# Patient Record
Sex: Male | Born: 1945 | Race: White | Hispanic: No | Marital: Married | Smoking: Former smoker
Health system: Southern US, Community
[De-identification: ages and names within clinical notes are randomized; demographics above are authoritative.]

## PROBLEM LIST (undated history)

## (undated) DIAGNOSIS — J986 Disorders of diaphragm: Secondary | ICD-10-CM

## (undated) DIAGNOSIS — J449 Chronic obstructive pulmonary disease, unspecified: Secondary | ICD-10-CM

## (undated) DIAGNOSIS — I10 Essential (primary) hypertension: Secondary | ICD-10-CM

## (undated) DIAGNOSIS — C801 Malignant (primary) neoplasm, unspecified: Secondary | ICD-10-CM

## (undated) DIAGNOSIS — C4490 Unspecified malignant neoplasm of skin, unspecified: Secondary | ICD-10-CM

## (undated) HISTORY — PX: OTHER SURGICAL HISTORY: SHX169

---

## 2002-11-03 ENCOUNTER — Encounter: Admission: RE | Admit: 2002-11-03 | Discharge: 2002-11-03 | Payer: Self-pay | Admitting: Emergency Medicine

## 2002-11-03 ENCOUNTER — Encounter: Payer: Self-pay | Admitting: Emergency Medicine

## 2003-10-02 ENCOUNTER — Ambulatory Visit (HOSPITAL_BASED_OUTPATIENT_CLINIC_OR_DEPARTMENT_OTHER): Admission: RE | Admit: 2003-10-02 | Discharge: 2003-10-02 | Payer: Self-pay | Admitting: Emergency Medicine

## 2004-08-26 ENCOUNTER — Encounter: Admission: RE | Admit: 2004-08-26 | Discharge: 2004-08-26 | Payer: Self-pay | Admitting: Emergency Medicine

## 2004-10-18 ENCOUNTER — Ambulatory Visit: Payer: Self-pay | Admitting: Pain Medicine

## 2004-11-02 ENCOUNTER — Ambulatory Visit: Payer: Self-pay | Admitting: Pain Medicine

## 2004-11-24 ENCOUNTER — Ambulatory Visit: Payer: Self-pay | Admitting: Pain Medicine

## 2004-11-30 ENCOUNTER — Ambulatory Visit: Payer: Self-pay | Admitting: Pain Medicine

## 2004-12-28 ENCOUNTER — Ambulatory Visit: Payer: Self-pay | Admitting: Pain Medicine

## 2005-01-24 ENCOUNTER — Ambulatory Visit: Payer: Self-pay | Admitting: Pain Medicine

## 2005-02-01 ENCOUNTER — Ambulatory Visit: Payer: Self-pay | Admitting: Pain Medicine

## 2005-02-22 ENCOUNTER — Ambulatory Visit: Payer: Self-pay | Admitting: Internal Medicine

## 2005-03-01 ENCOUNTER — Ambulatory Visit: Payer: Self-pay | Admitting: Internal Medicine

## 2005-03-01 ENCOUNTER — Encounter (INDEPENDENT_AMBULATORY_CARE_PROVIDER_SITE_OTHER): Payer: Self-pay | Admitting: *Deleted

## 2005-03-16 ENCOUNTER — Ambulatory Visit: Payer: Self-pay | Admitting: Pain Medicine

## 2005-03-22 ENCOUNTER — Ambulatory Visit: Payer: Self-pay | Admitting: Pain Medicine

## 2005-04-03 ENCOUNTER — Encounter: Admission: RE | Admit: 2005-04-03 | Discharge: 2005-04-03 | Payer: Self-pay | Admitting: Emergency Medicine

## 2005-05-05 ENCOUNTER — Inpatient Hospital Stay (HOSPITAL_COMMUNITY): Admission: RE | Admit: 2005-05-05 | Discharge: 2005-05-09 | Payer: Self-pay | Admitting: Neurosurgery

## 2006-04-06 ENCOUNTER — Encounter: Admission: RE | Admit: 2006-04-06 | Discharge: 2006-04-06 | Payer: Self-pay | Admitting: Emergency Medicine

## 2006-04-16 ENCOUNTER — Ambulatory Visit (HOSPITAL_COMMUNITY): Admission: RE | Admit: 2006-04-16 | Discharge: 2006-04-16 | Payer: Self-pay | Admitting: Emergency Medicine

## 2007-01-28 ENCOUNTER — Ambulatory Visit: Payer: Self-pay | Admitting: Internal Medicine

## 2007-02-12 ENCOUNTER — Encounter: Payer: Self-pay | Admitting: Internal Medicine

## 2007-02-12 ENCOUNTER — Ambulatory Visit: Payer: Self-pay | Admitting: Internal Medicine

## 2008-12-11 ENCOUNTER — Ambulatory Visit: Payer: Self-pay | Admitting: Internal Medicine

## 2008-12-22 ENCOUNTER — Ambulatory Visit: Payer: Self-pay | Admitting: Internal Medicine

## 2009-08-05 ENCOUNTER — Ambulatory Visit: Payer: Self-pay | Admitting: Internal Medicine

## 2010-01-19 ENCOUNTER — Ambulatory Visit: Payer: Self-pay | Admitting: Urology

## 2010-01-24 ENCOUNTER — Ambulatory Visit: Payer: Self-pay | Admitting: Urology

## 2011-02-10 NOTE — Op Note (Signed)
NAMEFELIBERTO, STOCKLEY                 ACCOUNT NO.:  [id number]   MEDICAL RECORD NO.:  [id number]          PATIENT TYPE:  INP   LOCATION:  3010                         FACILITY:  [facility]   PHYSICIAN:  Hilda Lias, M.D.   DATE OF BIRTH:  1946-03-14   DATE OF PROCEDURE:  05/05/2005  DATE OF DISCHARGE:                                 OPERATIVE REPORT   PREOPERATIVE DIAGNOSIS:  Status post L4 diskectomy, severe degenerative disk  disease L4-5 with facet arthropathy, chronic L5 radiculopathy.   POSTOPERATIVE DIAGNOSIS:  Status post L4 diskectomy, severe degenerative  disk disease L4-5 with facet arthropathy, chronic L5 radiculopathy.   OPERATION PERFORMED:  Bilateral L4 laminectomy, L4 facetectomy, lysis of  adhesions with decompression of the L4 and L5 nerve root.  Pedicle screws L4-  5, posterolateral arthrodesis with bone morphogenic protein and autograft.   SURGEON:  Hilda Lias, M.D.   ASSISTANT:  Payton Doughty, M.D.   ANESTHESIA:  General.   INDICATIONS FOR PROCEDURE:  The patient is being admitted because of back  pain which has been getting worse for the past two years.  The pain goes to  both sides, left worse than the right.  The patient is quite miserable.  He  has failed with conservative treatment.  The MRI shows severe case of  degenerative disk disease at the level of L4-5 with almost no space in  between with foraminal stenosis.  Surgery was advised. The risks were  explained to him in the history and physical.   DESCRIPTION OF PROCEDURE:  The patient was taken to the operating room and  after intubation, he was positioned in prone manner.  The back was prepped  with Betadine.  A midline incision from L3-L4 down to L4-L5 was made.  Muscle was retracted bilaterally. Patient had quite a bit of arthritis and  it was quite difficult to disattach the fascia and the muscle away from the  facets.  We did X-ray which indeed showed that we were at the level of L4-5.  From  then on, we proceeded to remove the spinous process of L4 and then we  went bilaterally to remove the L4 lamina and facet.  The dura mater was  thin.  The patient had quite a bit of adhesion bilaterally to the point  there was an opening the dura mater in two locations.  The nerve root  ____________ to the L4 with quite a bit of adhesion in to the canal.  Lysis  was accomplished and at the end we had good decompression of the both L4 and  L5 bilaterally.  We tried to enter the disk space but indeed the space was  so narrowed, it was impossible to go inside with 15 surgical blade.  Then  with the C-arm using the AP and lateral view, we introduced the pedicle  probe followed by pedicle screws at the level of L4-L5.  The pedicle 5.5 x  45.  The pedicle screw was secured in place using a rod with a cap.  AP and  lateral showed good position of the pedicle  screw. Then we went laterally.  We drilled the lateral aspect of the facet _________ as well as the  transverse process of 4-5 and a mix of autograft and bone morphogenic  protein was used for fusion.  From then on, the area was irrigated.  Although we closed the dura mater with 6-0 Prolene in those two locations we  left Duragen on top of that.  From then on, the area was irrigated and  closed with Vicryl and nylon.       EB/MEDQ  D:  05/05/2005  T:  05/06/2005  Job:  [id number]

## 2011-02-10 NOTE — Discharge Summary (Signed)
NAMEKILAN, BANFILL                 ACCOUNT NO.:  [id number]   MEDICAL RECORD NO.:  [id number]          PATIENT TYPE:  INP   LOCATION:  3010                         FACILITY:  [facility]   PHYSICIAN:  Hilda Lias, M.D.   DATE OF BIRTH:  07-Jul-1946   DATE OF ADMISSION:  05/05/2005  DATE OF DISCHARGE:  05/09/2005                                 DISCHARGE SUMMARY   ADMISSION DIAGNOSIS:  Degenerative disk disease L4-5 with chronic  radiculopathy.   FINAL DIAGNOSIS:  Degenerative disk disease L4-5 with chronic radiculopathy.   CLINICAL HISTORY:  The patient was admitted because of back pain with  radiation into both legs.  X-rays show a severe case of degenerative disk  disease at the level of L4-5.  Surgery was advised.   LABORATORY:  Normal.   COURSE IN THE HOSPITAL:  The patient was taken to surgery and a bilateral  __________ laminectomy with facetectomy using pedicle screw and  posterolateral arthrodesis was done.  The patient today is doing much  better.  He still has some incisional pain but not like before.  He has no  weakness.  He has been discharged today to be followed by [place] in the office  in 2 weeks.   CONDITION ON DISCHARGE:  Improving.   MEDICATION:  Mepergan, Flexeril and Neurontin.   DIET:  Regular.   ACTIVITY:  Not to drive for at least until I see him.   CONDITION AT DISCHARGE:  Improving.   FOLLOWUP:  He is going to be seen by me in 2 weeks.           ______________________________  Hilda Lias, M.D.     EB/MEDQ  D:  05/09/2005  T:  05/09/2005  Job:  [id number]

## 2011-12-20 ENCOUNTER — Encounter: Payer: Self-pay | Admitting: Internal Medicine

## 2012-05-17 ENCOUNTER — Emergency Department: Payer: Self-pay | Admitting: Emergency Medicine

## 2012-05-17 LAB — PROTIME-INR: Prothrombin Time: 12.4 secs (ref 11.5–14.7)

## 2012-05-17 LAB — BASIC METABOLIC PANEL
Anion Gap: 6 — ABNORMAL LOW (ref 7–16)
Calcium, Total: 8.7 mg/dL (ref 8.5–10.1)
Chloride: 99 mmol/L (ref 98–107)
Co2: 31 mmol/L (ref 21–32)
EGFR (Non-African Amer.): 48 — ABNORMAL LOW
Glucose: 126 mg/dL — ABNORMAL HIGH (ref 65–99)
Osmolality: 280 (ref 275–301)
Potassium: 3.7 mmol/L (ref 3.5–5.1)
Sodium: 136 mmol/L (ref 136–145)

## 2012-05-17 LAB — CBC
HCT: 39.2 % — ABNORMAL LOW
HGB: 12.8 g/dL — ABNORMAL LOW
MCH: 26.3 pg
MCHC: 32.6 g/dL
MCV: 81 fL
Platelet: 213 x10 3/mm 3
RBC: 4.86 x10 6/mm 3
RDW: 14 %
WBC: 10 x10 3/mm 3

## 2012-06-10 ENCOUNTER — Ambulatory Visit: Payer: Self-pay | Admitting: Internal Medicine

## 2012-12-25 ENCOUNTER — Encounter: Payer: Self-pay | Admitting: Internal Medicine

## 2013-01-08 ENCOUNTER — Encounter: Payer: Self-pay | Admitting: Internal Medicine

## 2013-08-15 ENCOUNTER — Ambulatory Visit: Payer: Self-pay | Admitting: Unknown Physician Specialty

## 2013-12-31 ENCOUNTER — Ambulatory Visit: Payer: Self-pay | Admitting: Cardiology

## 2014-02-24 DIAGNOSIS — J449 Chronic obstructive pulmonary disease, unspecified: Secondary | ICD-10-CM | POA: Insufficient documentation

## 2014-02-24 DIAGNOSIS — G4730 Sleep apnea, unspecified: Secondary | ICD-10-CM | POA: Insufficient documentation

## 2014-04-08 DIAGNOSIS — R0602 Shortness of breath: Secondary | ICD-10-CM | POA: Insufficient documentation

## 2014-05-05 ENCOUNTER — Ambulatory Visit: Payer: Self-pay | Admitting: Internal Medicine

## 2014-05-08 ENCOUNTER — Encounter: Payer: Self-pay | Admitting: Podiatry

## 2014-05-08 ENCOUNTER — Other Ambulatory Visit: Payer: Self-pay | Admitting: *Deleted

## 2014-05-08 ENCOUNTER — Ambulatory Visit (INDEPENDENT_AMBULATORY_CARE_PROVIDER_SITE_OTHER): Payer: Medicare Other | Admitting: Podiatry

## 2014-05-08 VITALS — BP 130/72 | HR 82 | Resp 16 | Ht 74.0 in | Wt 240.0 lb

## 2014-05-08 DIAGNOSIS — F329 Major depressive disorder, single episode, unspecified: Secondary | ICD-10-CM | POA: Insufficient documentation

## 2014-05-08 DIAGNOSIS — E785 Hyperlipidemia, unspecified: Secondary | ICD-10-CM | POA: Insufficient documentation

## 2014-05-08 DIAGNOSIS — J984 Other disorders of lung: Secondary | ICD-10-CM | POA: Insufficient documentation

## 2014-05-08 DIAGNOSIS — I10 Essential (primary) hypertension: Secondary | ICD-10-CM | POA: Insufficient documentation

## 2014-05-08 DIAGNOSIS — F32A Depression, unspecified: Secondary | ICD-10-CM | POA: Insufficient documentation

## 2014-05-08 DIAGNOSIS — M722 Plantar fascial fibromatosis: Secondary | ICD-10-CM

## 2014-05-08 MED ORDER — TRIAMCINOLONE ACETONIDE 10 MG/ML IJ SUSP
10.0000 mg | Freq: Once | INTRAMUSCULAR | Status: AC
Start: 2014-05-08 — End: 2014-05-08
  Administered 2014-05-08: 10 mg

## 2014-05-08 NOTE — Progress Notes (Signed)
Subjective:     Patient ID: Daniel [name], male   DOB: 10/05/1945, 68 y.o.   MRN: [id number]  HPI patient states that my left heel has started to hurt me and I wanted to have this before it gets bad   Review of Systems     Objective:   Physical Exam Neurovascular status unchanged with pain in left plantar fascia at the insertion of the tendon into the calcaneus    Assessment:     Plantar fasciitis left with inflammation    Plan:     Injected the left plantar fashion 3 mg Kenalog 5 mg like Marcaine mixture and instructed on physical therapy supportive shoes

## 2014-07-07 ENCOUNTER — Emergency Department (HOSPITAL_COMMUNITY): Payer: No Typology Code available for payment source

## 2014-07-07 ENCOUNTER — Encounter (HOSPITAL_COMMUNITY): Payer: Self-pay | Admitting: Emergency Medicine

## 2014-07-07 ENCOUNTER — Inpatient Hospital Stay (HOSPITAL_COMMUNITY)
Admission: EM | Admit: 2014-07-07 | Discharge: 2014-07-13 | DRG: 987 | Disposition: A | Discharge status: Home / Self Care | Payer: No Typology Code available for payment source | Attending: Emergency Medicine | Admitting: Emergency Medicine

## 2014-07-07 DIAGNOSIS — S2239XA Fracture of one rib, unspecified side, initial encounter for closed fracture: Secondary | ICD-10-CM

## 2014-07-07 DIAGNOSIS — S81822A Laceration with foreign body, left lower leg, initial encounter: Secondary | ICD-10-CM | POA: Diagnosis not present

## 2014-07-07 DIAGNOSIS — G9340 Encephalopathy, unspecified: Secondary | ICD-10-CM | POA: Diagnosis present

## 2014-07-07 DIAGNOSIS — E785 Hyperlipidemia, unspecified: Secondary | ICD-10-CM

## 2014-07-07 DIAGNOSIS — F32A Depression, unspecified: Secondary | ICD-10-CM

## 2014-07-07 DIAGNOSIS — J441 Chronic obstructive pulmonary disease with (acute) exacerbation: Secondary | ICD-10-CM

## 2014-07-07 DIAGNOSIS — J9602 Acute respiratory failure with hypercapnia: Principal | ICD-10-CM | POA: Diagnosis present

## 2014-07-07 DIAGNOSIS — J984 Other disorders of lung: Secondary | ICD-10-CM

## 2014-07-07 DIAGNOSIS — R05 Cough: Secondary | ICD-10-CM

## 2014-07-07 DIAGNOSIS — F329 Major depressive disorder, single episode, unspecified: Secondary | ICD-10-CM

## 2014-07-07 DIAGNOSIS — I10 Essential (primary) hypertension: Secondary | ICD-10-CM | POA: Diagnosis present

## 2014-07-07 DIAGNOSIS — S2232XA Fracture of one rib, left side, initial encounter for closed fracture: Secondary | ICD-10-CM

## 2014-07-07 DIAGNOSIS — F17210 Nicotine dependence, cigarettes, uncomplicated: Secondary | ICD-10-CM | POA: Diagnosis present

## 2014-07-07 DIAGNOSIS — E872 Acidosis: Secondary | ICD-10-CM | POA: Diagnosis present

## 2014-07-07 DIAGNOSIS — R0902 Hypoxemia: Secondary | ICD-10-CM | POA: Diagnosis present

## 2014-07-07 DIAGNOSIS — J9620 Acute and chronic respiratory failure, unspecified whether with hypoxia or hypercapnia: Secondary | ICD-10-CM | POA: Diagnosis present

## 2014-07-07 DIAGNOSIS — G8929 Other chronic pain: Secondary | ICD-10-CM | POA: Diagnosis present

## 2014-07-07 DIAGNOSIS — J9621 Acute and chronic respiratory failure with hypoxia: Secondary | ICD-10-CM

## 2014-07-07 DIAGNOSIS — Z7982 Long term (current) use of aspirin: Secondary | ICD-10-CM

## 2014-07-07 DIAGNOSIS — G4733 Obstructive sleep apnea (adult) (pediatric): Secondary | ICD-10-CM | POA: Diagnosis present

## 2014-07-07 DIAGNOSIS — S2239XD Fracture of one rib, unspecified side, subsequent encounter for fracture with routine healing: Secondary | ICD-10-CM

## 2014-07-07 DIAGNOSIS — Z885 Allergy status to narcotic agent status: Secondary | ICD-10-CM

## 2014-07-07 DIAGNOSIS — J42 Unspecified chronic bronchitis: Secondary | ICD-10-CM

## 2014-07-07 DIAGNOSIS — S300XXA Contusion of lower back and pelvis, initial encounter: Secondary | ICD-10-CM | POA: Diagnosis present

## 2014-07-07 DIAGNOSIS — J9622 Acute and chronic respiratory failure with hypercapnia: Secondary | ICD-10-CM

## 2014-07-07 DIAGNOSIS — R059 Cough, unspecified: Secondary | ICD-10-CM

## 2014-07-07 DIAGNOSIS — J449 Chronic obstructive pulmonary disease, unspecified: Secondary | ICD-10-CM | POA: Diagnosis present

## 2014-07-07 DIAGNOSIS — G2581 Restless legs syndrome: Secondary | ICD-10-CM | POA: Diagnosis present

## 2014-07-07 DIAGNOSIS — R0602 Shortness of breath: Secondary | ICD-10-CM

## 2014-07-07 DIAGNOSIS — Z9981 Dependence on supplemental oxygen: Secondary | ICD-10-CM

## 2014-07-07 DIAGNOSIS — S81812A Laceration without foreign body, left lower leg, initial encounter: Secondary | ICD-10-CM | POA: Diagnosis not present

## 2014-07-07 DIAGNOSIS — Z79899 Other long term (current) drug therapy: Secondary | ICD-10-CM

## 2014-07-07 DIAGNOSIS — G4730 Sleep apnea, unspecified: Secondary | ICD-10-CM

## 2014-07-07 DIAGNOSIS — Z91030 Bee allergy status: Secondary | ICD-10-CM

## 2014-07-07 DIAGNOSIS — J9601 Acute respiratory failure with hypoxia: Secondary | ICD-10-CM | POA: Diagnosis present

## 2014-07-07 HISTORY — DX: Essential (primary) hypertension: I10

## 2014-07-07 HISTORY — DX: Disorders of diaphragm: J98.6

## 2014-07-07 HISTORY — DX: Unspecified malignant neoplasm of skin, unspecified: C44.90

## 2014-07-07 HISTORY — DX: Malignant (primary) neoplasm, unspecified: C80.1

## 2014-07-07 HISTORY — DX: Chronic obstructive pulmonary disease, unspecified: J44.9

## 2014-07-07 LAB — BASIC METABOLIC PANEL
ANION GAP: 15 (ref 5–15)
BUN: 15 mg/dL (ref 6–23)
CHLORIDE: 101 meq/L (ref 96–112)
CO2: 26 meq/L (ref 19–32)
Calcium: 9.6 mg/dL (ref 8.4–10.5)
Creatinine, Ser: 1.2 mg/dL (ref 0.50–1.35)
GFR, EST AFRICAN AMERICAN: 71 mL/min — AB (ref >90)
GFR, EST NON AFRICAN AMERICAN: 61 mL/min — AB (ref >90)
Glucose, Bld: 121 mg/dL — ABNORMAL HIGH (ref 70–99)
POTASSIUM: 3.9 meq/L (ref 3.7–5.3)
SODIUM: 142 meq/L (ref 137–147)

## 2014-07-07 LAB — CBC WITH DIFFERENTIAL/PLATELET
Basophils Absolute: 0 10*3/uL (ref 0.0–0.1)
Basophils Relative: 0 % (ref 0–1)
EOS ABS: 0.1 10*3/uL (ref 0.0–0.7)
EOS PCT: 0 % (ref 0–5)
HEMATOCRIT: 49.2 % (ref 39.0–52.0)
Hemoglobin: 15.5 g/dL (ref 13.0–17.0)
LYMPHS PCT: 10 % — AB (ref 12–46)
Lymphs Abs: 1.4 10*3/uL (ref 0.7–4.0)
MCH: 25.6 pg — ABNORMAL LOW (ref 26.0–34.0)
MCHC: 31.5 g/dL (ref 30.0–36.0)
MCV: 81.2 fL (ref 78.0–100.0)
MONO ABS: 0.7 10*3/uL (ref 0.1–1.0)
Monocytes Relative: 5 % (ref 3–12)
NEUTROS PCT: 85 % — AB (ref 43–77)
Neutro Abs: 11.6 10*3/uL — ABNORMAL HIGH (ref 1.7–7.7)
Platelets: 200 10*3/uL (ref 150–400)
RBC: 6.06 MIL/uL — ABNORMAL HIGH (ref 4.22–5.81)
RDW: 16 % — AB (ref 11.5–15.5)
WBC: 13.7 10*3/uL — ABNORMAL HIGH (ref 4.0–10.5)

## 2014-07-07 MED ORDER — IOHEXOL 300 MG/ML  SOLN
100.0000 mL | Freq: Once | INTRAMUSCULAR | Status: AC | PRN
  Administered 2014-07-07: 100 mL via INTRAVENOUS

## 2014-07-07 MED ORDER — ONDANSETRON HCL 4 MG/2ML IJ SOLN
4.0000 mg | Freq: Once | INTRAMUSCULAR | Status: AC | PRN
  Administered 2014-07-07: 4 mg via INTRAVENOUS
  Filled 2014-07-07: qty 2

## 2014-07-07 MED ORDER — MORPHINE SULFATE 4 MG/ML IJ SOLN
4.0000 mg | INTRAMUSCULAR | Status: DC | PRN
  Administered 2014-07-07: 4 mg via INTRAVENOUS
  Filled 2014-07-07 (×3): qty 1

## 2014-07-07 MED ORDER — LIDOCAINE-EPINEPHRINE 1 %-1:100000 IJ SOLN
10.0000 mL | Freq: Once | INTRAMUSCULAR | Status: AC
  Administered 2014-07-08: 1 mL
  Filled 2014-07-07: qty 1

## 2014-07-07 NOTE — ED Notes (Signed)
Received call from CT, pt unable to lie down flat and requesting pain medication.
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
## 2014-07-08 ENCOUNTER — Observation Stay (HOSPITAL_COMMUNITY): Payer: No Typology Code available for payment source

## 2014-07-08 ENCOUNTER — Encounter (HOSPITAL_COMMUNITY): Payer: Self-pay | Admitting: *Deleted

## 2014-07-08 DIAGNOSIS — S81822A Laceration with foreign body, left lower leg, initial encounter: Secondary | ICD-10-CM | POA: Diagnosis present

## 2014-07-08 DIAGNOSIS — Z9981 Dependence on supplemental oxygen: Secondary | ICD-10-CM | POA: Diagnosis not present

## 2014-07-08 DIAGNOSIS — J9602 Acute respiratory failure with hypercapnia: Secondary | ICD-10-CM | POA: Diagnosis present

## 2014-07-08 DIAGNOSIS — J449 Chronic obstructive pulmonary disease, unspecified: Secondary | ICD-10-CM | POA: Diagnosis present

## 2014-07-08 DIAGNOSIS — R0602 Shortness of breath: Secondary | ICD-10-CM

## 2014-07-08 DIAGNOSIS — J9601 Acute respiratory failure with hypoxia: Secondary | ICD-10-CM | POA: Diagnosis present

## 2014-07-08 DIAGNOSIS — J9691 Respiratory failure, unspecified with hypoxia: Secondary | ICD-10-CM | POA: Diagnosis not present

## 2014-07-08 DIAGNOSIS — S2232XA Fracture of one rib, left side, initial encounter for closed fracture: Secondary | ICD-10-CM | POA: Diagnosis present

## 2014-07-08 DIAGNOSIS — I10 Essential (primary) hypertension: Secondary | ICD-10-CM | POA: Diagnosis present

## 2014-07-08 DIAGNOSIS — Z79899 Other long term (current) drug therapy: Secondary | ICD-10-CM | POA: Diagnosis not present

## 2014-07-08 DIAGNOSIS — S300XXA Contusion of lower back and pelvis, initial encounter: Secondary | ICD-10-CM | POA: Diagnosis present

## 2014-07-08 DIAGNOSIS — R0902 Hypoxemia: Secondary | ICD-10-CM | POA: Diagnosis present

## 2014-07-08 DIAGNOSIS — G9340 Encephalopathy, unspecified: Secondary | ICD-10-CM | POA: Diagnosis present

## 2014-07-08 DIAGNOSIS — Z885 Allergy status to narcotic agent status: Secondary | ICD-10-CM | POA: Diagnosis not present

## 2014-07-08 DIAGNOSIS — Z7982 Long term (current) use of aspirin: Secondary | ICD-10-CM | POA: Diagnosis not present

## 2014-07-08 DIAGNOSIS — S81812A Laceration without foreign body, left lower leg, initial encounter: Secondary | ICD-10-CM | POA: Diagnosis present

## 2014-07-08 DIAGNOSIS — G4733 Obstructive sleep apnea (adult) (pediatric): Secondary | ICD-10-CM | POA: Diagnosis present

## 2014-07-08 DIAGNOSIS — E872 Acidosis: Secondary | ICD-10-CM | POA: Diagnosis present

## 2014-07-08 DIAGNOSIS — Z91030 Bee allergy status: Secondary | ICD-10-CM | POA: Diagnosis not present

## 2014-07-08 DIAGNOSIS — G4730 Sleep apnea, unspecified: Secondary | ICD-10-CM

## 2014-07-08 DIAGNOSIS — F17210 Nicotine dependence, cigarettes, uncomplicated: Secondary | ICD-10-CM | POA: Diagnosis present

## 2014-07-08 DIAGNOSIS — S2239XA Fracture of one rib, unspecified side, initial encounter for closed fracture: Secondary | ICD-10-CM | POA: Diagnosis present

## 2014-07-08 DIAGNOSIS — G8929 Other chronic pain: Secondary | ICD-10-CM | POA: Diagnosis present

## 2014-07-08 DIAGNOSIS — G2581 Restless legs syndrome: Secondary | ICD-10-CM | POA: Diagnosis present

## 2014-07-08 LAB — BLOOD GAS, ARTERIAL
Acid-Base Excess: 1.6 mmol/L (ref 0.0–2.0)
Acid-Base Excess: 2.7 mmol/L — ABNORMAL HIGH (ref 0.0–2.0)
BICARBONATE: 29.2 meq/L — AB (ref 20.0–24.0)
Bicarbonate: 29 mEq/L — ABNORMAL HIGH (ref 20.0–24.0)
DELIVERY SYSTEMS: POSITIVE
DRAWN BY: 41875
Drawn by: 406621
EXPIRATORY PAP: 6
FIO2: 0.4 %
FIO2: 0.4 %
Inspiratory PAP: 12
MODE: POSITIVE
O2 SAT: 89 %
O2 Saturation: 94.5 %
PATIENT TEMPERATURE: 98.6
PH ART: 7.191 — AB (ref 7.350–7.450)
PH ART: 7.269 — AB (ref 7.350–7.450)
PO2 ART: 67 mmHg — AB (ref 80.0–100.0)
PO2 ART: 76.1 mmHg — AB (ref 80.0–100.0)
Patient temperature: 98.6
RATE: 12 resp/min
TCO2: 31.6 mmol/L (ref 0–100)
TCO2: 31 mmol/L (ref 0–100)
pCO2 arterial: 79.2 mmHg (ref 35.0–45.0)
pCO2 arterial: 65.4 mmHg (ref 35.0–45.0)

## 2014-07-08 LAB — POCT I-STAT 3, ART BLOOD GAS (G3+)
Acid-Base Excess: 5 mmol/L — ABNORMAL HIGH (ref 0.0–2.0)
BICARBONATE: 33.8 meq/L — AB (ref 20.0–24.0)
O2 Saturation: 96 %
PCO2 ART: 65.6 mmHg — AB (ref 35.0–45.0)
Patient temperature: 37
TCO2: 36 mmol/L (ref 0–100)
pH, Arterial: 7.32 — ABNORMAL LOW (ref 7.350–7.450)
pO2, Arterial: 96 mmHg (ref 80.0–100.0)

## 2014-07-08 LAB — PROTIME-INR
INR: 1.02 (ref 0.00–1.49)
PROTHROMBIN TIME: 13.4 s (ref 11.6–15.2)

## 2014-07-08 MED ORDER — HYDROMORPHONE HCL 1 MG/ML IJ SOLN
1.0000 mg | INTRAMUSCULAR | Status: DC | PRN
  Administered 2014-07-08: 1 mg via INTRAVENOUS
  Filled 2014-07-08: qty 1

## 2014-07-08 MED ORDER — OXYCODONE HCL 5 MG PO TABS: 5.0000 mg | ORAL_TABLET | ORAL | Status: DC | PRN

## 2014-07-08 MED ORDER — OXYCODONE-ACETAMINOPHEN 5-325 MG PO TABS: 2.0000 | ORAL_TABLET | ORAL | Status: DC | PRN

## 2014-07-08 MED ORDER — ONDANSETRON HCL 4 MG/2ML IJ SOLN
4.0000 mg | Freq: Four times a day (QID) | INTRAMUSCULAR | Status: DC | PRN
Start: 2014-07-08 — End: 2014-07-13
  Administered 2014-07-08 – 2014-07-09 (×3): 4 mg via INTRAVENOUS
  Filled 2014-07-08 (×3): qty 2

## 2014-07-08 MED ORDER — MORPHINE SULFATE 4 MG/ML IJ SOLN
4.0000 mg | INTRAMUSCULAR | Status: DC | PRN
  Administered 2014-07-08 (×2): 4 mg via INTRAVENOUS

## 2014-07-08 MED ORDER — POTASSIUM CHLORIDE CRYS ER 20 MEQ PO TBCR
40.0000 meq | EXTENDED_RELEASE_TABLET | Freq: Once | ORAL | Status: AC
  Administered 2014-07-08: 40 meq via ORAL
  Filled 2014-07-08: qty 2

## 2014-07-08 MED ORDER — ONDANSETRON HCL 4 MG/2ML IJ SOLN
4.0000 mg | Freq: Once | INTRAMUSCULAR | Status: AC
  Administered 2014-07-08: 4 mg via INTRAVENOUS
  Filled 2014-07-08: qty 2

## 2014-07-08 MED ORDER — MORPHINE SULFATE 4 MG/ML IJ SOLN: 4.0000 mg | INTRAMUSCULAR | Status: DC | PRN

## 2014-07-08 MED ORDER — SERTRALINE HCL 100 MG PO TABS
100.0000 mg | ORAL_TABLET | Freq: Every day | ORAL | Status: DC
  Administered 2014-07-08 – 2014-07-13 (×6): 100 mg via ORAL
  Filled 2014-07-08 (×6): qty 1

## 2014-07-08 MED ORDER — ENOXAPARIN SODIUM 30 MG/0.3ML ~~LOC~~ SOLN
30.0000 mg | Freq: Two times a day (BID) | SUBCUTANEOUS | Status: DC
  Administered 2014-07-08 – 2014-07-13 (×11): 30 mg via SUBCUTANEOUS
  Filled 2014-07-08 (×16): qty 0.3

## 2014-07-08 MED ORDER — METHOCARBAMOL 500 MG PO TABS: 500.0000 mg | ORAL_TABLET | Freq: Two times a day (BID) | ORAL | Status: AC

## 2014-07-08 MED ORDER — FUROSEMIDE 10 MG/ML IJ SOLN
40.0000 mg | Freq: Two times a day (BID) | INTRAMUSCULAR | Status: AC
  Administered 2014-07-08 (×2): 40 mg via INTRAVENOUS
  Filled 2014-07-08 (×2): qty 4

## 2014-07-08 MED ORDER — NAPROXEN 500 MG PO TABS: 500.0000 mg | ORAL_TABLET | Freq: Two times a day (BID) | ORAL | Status: AC

## 2014-07-08 MED ORDER — ONDANSETRON HCL 4 MG PO TABS: 4.0000 mg | ORAL_TABLET | Freq: Four times a day (QID) | ORAL | Status: DC | PRN

## 2014-07-08 MED ORDER — HYDROMORPHONE HCL 1 MG/ML IJ SOLN
1.5000 mg | INTRAMUSCULAR | Status: DC | PRN
Start: 2014-07-08 — End: 2014-07-08

## 2014-07-08 MED ORDER — HYDROCHLOROTHIAZIDE 25 MG PO TABS
25.0000 mg | ORAL_TABLET | Freq: Every day | ORAL | Status: DC
  Administered 2014-07-08 – 2014-07-13 (×6): 25 mg via ORAL
  Filled 2014-07-08 (×6): qty 1

## 2014-07-08 MED ORDER — ENOXAPARIN SODIUM 40 MG/0.4ML ~~LOC~~ SOLN
40.0000 mg | SUBCUTANEOUS | Status: DC
  Filled 2014-07-08: qty 0.4

## 2014-07-08 MED ORDER — POTASSIUM CHLORIDE IN NACL 20-0.9 MEQ/L-% IV SOLN
INTRAVENOUS | Status: DC
  Administered 2014-07-08: 06:00:00 via INTRAVENOUS
  Filled 2014-07-08: qty 1000

## 2014-07-08 MED ORDER — TRAMADOL HCL 50 MG PO TABS
50.0000 mg | ORAL_TABLET | Freq: Four times a day (QID) | ORAL | Status: DC | PRN
  Administered 2014-07-08 – 2014-07-09 (×2): 100 mg via ORAL
  Filled 2014-07-08 (×2): qty 2

## 2014-07-08 MED ORDER — HYDROMORPHONE HCL 1 MG/ML IJ SOLN
0.5000 mg | INTRAMUSCULAR | Status: DC | PRN
  Administered 2014-07-08 (×2): 0.5 mg via INTRAVENOUS
  Filled 2014-07-08 (×2): qty 1

## 2014-07-08 MED ORDER — TIOTROPIUM BROMIDE MONOHYDRATE 18 MCG IN CAPS
18.0000 ug | ORAL_CAPSULE | Freq: Every day | RESPIRATORY_TRACT | Status: DC
  Administered 2014-07-08 – 2014-07-11 (×3): 18 ug via RESPIRATORY_TRACT
  Filled 2014-07-08 (×2): qty 5

## 2014-07-08 MED ORDER — HYDROMORPHONE HCL 1 MG/ML IJ SOLN: 0.5000 mg | INTRAMUSCULAR | Status: DC | PRN

## 2014-07-08 MED ORDER — OXYCODONE HCL 5 MG PO TABS: 10.0000 mg | ORAL_TABLET | ORAL | Status: DC | PRN

## 2014-07-08 MED ORDER — MOMETASONE FURO-FORMOTEROL FUM 100-5 MCG/ACT IN AERO
2.0000 | INHALATION_SPRAY | Freq: Two times a day (BID) | RESPIRATORY_TRACT | Status: DC
  Administered 2014-07-08 – 2014-07-11 (×4): 2 via RESPIRATORY_TRACT
  Filled 2014-07-08 (×3): qty 8.8

## 2014-07-08 NOTE — ED Notes (Signed)
EDP informed of pt's 02 sats reading 77% on RA.

## 2014-07-08 NOTE — Progress Notes (Signed)
6948 Patient admitted to room 6n23. Alert and oriented. Respirations slightly labor c/o left rib pain states it is a nine. O2 sats 89to 90% on 2l/m nasal cannula. Patient states he has  COPD and had been wearing his O2 at home on 3l/m nasal cannula. O2 increase to 3l/m. Diluadid 1mg  given IV at 0453. 0535 Patient awake states pain a little better. O2 sat 86% on 3l/m. Increase O2 to 4l/m O2 sats drop to 68%. Respiratory therapy notified and stated to apply venturi mask at 3l/m. Venti mask applied at 28% on 3l/m. Patient restless and diaphoretic. Patient respirations labor. Respiratory therapy notified and came and assess patient. Dr. Ninfa Linden on the unit and was told of patient's respiratory status. Dr. Ninfa Linden stated to transfer patient to stepdown and order pcxray and abgs. 5462 Vital signs P118 R 24 B/P 129/79 O2 sats 87% on 8l/m. Patient restless respirations labor and using abdominal muscles. 7035 vital signs P117 Sats 91%on 8l/m. ABGs result given to me by respiratory therapy. Stepdown bed obtained report call to [facility] and patient was transfer via bed to [facility]01 at 0702. Patient's wife was admitted to [facility] no other relatives listed. Call and talk to Laurence Slate nurse about condition of Mrs. Gutridge's husband. Charge Nurse stated that the son was on his way to see her and he would talk to him and not the patient cause she was already very tearful. Charge Nurse stated she would talk to [facility] when son gets to the unit.

## 2014-07-08 NOTE — Progress Notes (Signed)
RT to patient's room to placept back on BIPAP, but RN informed me that patient did vomit again.  Will leave patient on VM at this time.  RT will continue to monitor.

## 2014-07-08 NOTE — Progress Notes (Addendum)
Increased ipap to 15 and epap to 10 per Dr Stevenson Clinch (E-link MD) due to current ABG not showing any improvement since 12:00.
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
## 2014-07-08 NOTE — Progress Notes (Signed)
RN called RT to room when pt sats dropped into the 70's. RT placed pt on venturi device at 8LPM and sats are now in the low 90's pt is belly breathing and using accessory muscles. RTobtained ABG results are 7.191 CO2 of 79.2 and O2 of 67.0 RT informed RN or Panic values
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
## 2014-07-08 NOTE — Progress Notes (Signed)
ABG reviewed, noted improvement.   Plan: -continue bipap -repeat ABG at 4 pm -ok to remain on SDU  -minimize sedating meds as able -mobilize, upright positioning    Noe Gens, NP-C [facility] Pgr: [phone] or [phone]    Baltazar Apo, MD, PhD 07/09/2014, 12:06 PM [facility] [phone] or if no answer [phone]

## 2014-07-08 NOTE — ED Notes (Signed)
Rapid response RN at [facility] for pt, updating pt on pt's wife care.

## 2014-07-08 NOTE — Discharge Instructions (Signed)
Laceration Care, Adult °A laceration is a cut or lesion that goes through all layers of the skin and into the tissue just beneath the skin. °TREATMENT  °Some lacerations may not require closure. Some lacerations may not be able to be closed due to an increased risk of infection. It is important to see your caregiver as soon as possible after an injury to minimize the risk of infection and maximize the opportunity for successful closure. °If closure is appropriate, pain medicines may be given, if needed. The wound will be cleaned to help prevent infection. Your caregiver will use stitches (sutures), staples, wound glue (adhesive), or skin adhesive strips to repair the laceration. These tools bring the skin edges together to allow for faster healing and a better cosmetic outcome. However, all wounds will heal with a scar. Once the wound has healed, scarring can be minimized by covering the wound with sunscreen during the day for 1 full year. °HOME CARE INSTRUCTIONS  °For sutures or staples: °· Keep the wound clean and dry. °· If you were given a bandage (dressing), you should change it at least once a day. Also, change the dressing if it becomes wet or dirty, or as directed by your caregiver. °· Wash the wound with soap and water 2 times a day. Rinse the wound off with water to remove all soap. Pat the wound dry with a clean towel. °· After cleaning, apply a thin layer of the antibiotic ointment as recommended by your caregiver. This will help prevent infection and keep the dressing from sticking. °· You may shower as usual after the first 24 hours. Do not soak the wound in water until the sutures are removed. °· Only take over-the-counter or prescription medicines for pain, discomfort, or fever as directed by your caregiver. °· Get your sutures or staples removed as directed by your caregiver. °For skin adhesive strips: °· Keep the wound clean and dry. °· Do not get the skin adhesive strips wet. You may bathe  carefully, using caution to keep the wound dry. °· If the wound gets wet, pat it dry with a clean towel. °· Skin adhesive strips will fall off on their own. You may trim the strips as the wound heals. Do not remove skin adhesive strips that are still stuck to the wound. They will fall off in time. °For wound adhesive: °· You may briefly wet your wound in the shower or bath. Do not soak or scrub the wound. Do not swim. Avoid periods of heavy perspiration until the skin adhesive has fallen off on its own. After showering or bathing, gently pat the wound dry with a clean towel. °· Do not apply liquid medicine, cream medicine, or ointment medicine to your wound while the skin adhesive is in place. This may loosen the film before your wound is healed. °· If a dressing is placed over the wound, be careful not to apply tape directly over the skin adhesive. This may cause the adhesive to be pulled off before the wound is healed. °· Avoid prolonged exposure to sunlight or tanning lamps while the skin adhesive is in place. Exposure to ultraviolet light in the first year will darken the scar. °· The skin adhesive will usually remain in place for 5 to 10 days, then naturally fall off the skin. Do not pick at the adhesive film. °You may need a tetanus shot if: °· You cannot remember when you had your last tetanus shot. °· You have never had a tetanus   shot. If you get a tetanus shot, your arm may swell, get red, and feel warm to the touch. This is common and not a problem. If you need a tetanus shot and you choose not to have one, there is a rare chance of getting tetanus. Sickness from tetanus can be serious. SEEK MEDICAL CARE IF:   You have redness, swelling, or increasing pain in the wound.  You see a red line that goes away from the wound.  You have yellowish-white fluid (pus) coming from the wound.  You have a fever.  You notice a bad smell coming from the wound or dressing.  Your wound breaks open before or  after sutures have been removed.  You notice something coming out of the wound such as wood or glass.  Your wound is on your hand or foot and you cannot move a finger or toe. SEEK IMMEDIATE MEDICAL CARE IF:   Your pain is not controlled with prescribed medicine.  You have severe swelling around the wound causing pain and numbness or a change in color in your arm, hand, leg, or foot.  Your wound splits open and starts bleeding.  You have worsening numbness, weakness, or loss of function of any joint around or beyond the wound.  You develop painful lumps near the wound or on the skin anywhere on your body. MAKE SURE YOU:   Understand these instructions.  Will watch your condition.  Will get help right away if you are not doing well or get worse. Document Released: 09/11/2005 Document Revised: 12/04/2011 Document Reviewed: 03/07/2011 [facility] Patient Information 2015 [place], [place]. This information is not intended to replace advice given to you by your health care provider. Make sure you discuss any questions you have with your health care provider.  Rib Fracture A rib fracture is a break or crack in one of the bones of the ribs. The ribs are a group of long, curved bones that wrap around your chest and attach to your spine. They protect your lungs and other organs in the chest cavity. A broken or cracked rib is often painful, but most do not cause other problems. Most rib fractures heal on their own over time. However, rib fractures can be more serious if multiple ribs are broken or if broken ribs move out of place and push against other structures. CAUSES   A direct blow to the chest. For example, this could happen during contact sports, a car accident, or a fall against a hard object.  Repetitive movements with high force, such as pitching a baseball or having severe coughing spells. SYMPTOMS   Pain when you breathe in or cough.  Pain when someone presses on the injured  area. DIAGNOSIS  Your caregiver will perform a physical exam. Various imaging tests may be ordered to confirm the diagnosis and to look for related injuries. These tests may include a chest X-ray, computed tomography (CT), magnetic resonance imaging (MRI), or a bone scan. TREATMENT  Rib fractures usually heal on their own in 1-3 months. The longer healing period is often associated with a continued cough or other aggravating activities. During the healing period, pain control is very important. Medication is usually given to control pain. Hospitalization or surgery may be needed for more severe injuries, such as those in which multiple ribs are broken or the ribs have moved out of place.  HOME CARE INSTRUCTIONS   Avoid strenuous activity and any activities or movements that cause pain. Be careful during activities  and avoid bumping the injured rib.  Gradually increase activity as directed by your caregiver.  Only take over-the-counter or prescription medications as directed by your caregiver. Do not take other medications without asking your caregiver first.  Apply ice to the injured area for the first 1-2 days after you have been treated or as directed by your caregiver. Applying ice helps to reduce inflammation and pain.  Put ice in a plastic bag.  Place a towel between your skin and the bag.   Leave the ice on for 15-20 minutes at a time, every 2 hours while you are awake.  Perform deep breathing as directed by your caregiver. This will help prevent pneumonia, which is a common complication of a broken rib. Your caregiver may instruct you to:  Take deep breaths several times a day.  Try to cough several times a day, holding a pillow against the injured area.  Use a device called an incentive spirometer to practice deep breathing several times a day.  Drink enough fluids to keep your urine clear or pale yellow. This will help you avoid constipation.   Do not wear a rib belt or  binder. These restrict breathing, which can lead to pneumonia.  SEEK IMMEDIATE MEDICAL CARE IF:   You have a fever.   You have difficulty breathing or shortness of breath.   You develop a continual cough, or you cough up thick or bloody sputum.  You feel sick to your stomach (nausea), throw up (vomit), or have abdominal pain.   You have worsening pain not controlled with medications.  MAKE SURE YOU:  Understand these instructions.  Will watch your condition.  Will get help right away if you are not doing well or get worse. Document Released: 09/11/2005 Document Revised: 05/14/2013 Document Reviewed: 11/13/2012 [facility] Patient Information 2015 [place], [place]. This information is not intended to replace advice given to you by your health care provider. Make sure you discuss any questions you have with your health care provider.

## 2014-07-08 NOTE — ED Notes (Signed)
EDP informed of pt's 02 sats on RA. Please give pt incentive spirometer and have the pt work on deep breathing.

## 2014-07-08 NOTE — Progress Notes (Signed)
Patient ID: Daniel [name], male   DOB: 08-18-1946, 68 y.o.   MRN: [id number]   LOS: 1 day   Subjective: Doing ok, on BiPAP. Denies chest pain.   Objective: Vital signs in last 24 hours: Temp:  [98.2 F (36.8 C)-99 F (37.2 C)] 98.2 F (36.8 C) (10/14 0448) Pulse Rate:  [101-138] 110 (10/14 0914) Resp:  [19-29] 24 (10/14 0914) BP: (98-149)/(60-89) 119/75 mmHg (10/14 0715) SpO2:  [83 %-95 %] 95 % (10/14 0914) Weight:  [240 lb (108.863 kg)-242 lb 12.8 oz (110.133 kg)] 242 lb 12.8 oz (110.133 kg) (10/14 0448) Last BM Date: 07/07/14   Laboratory  CBC  Recent Labs  07/07/14 2115  WBC 13.7*  HGB 15.5  HCT 49.2  PLT 200   BMET  Recent Labs  07/07/14 2115  NA 142  K 3.9  CL 101  CO2 26  GLUCOSE 121*  BUN 15  CREATININE 1.20  CALCIUM 9.6   ABG    Component Value Date/Time   PHART 7.191* 07/08/2014 0635   PCO2ART 79.2* 07/08/2014 0635   PO2ART 67.0* 07/08/2014 0635   HCO3 29.2* 07/08/2014 0635   TCO2 31.6 07/08/2014 0635   O2SAT 89.0 07/08/2014 0635    Radiology Results PORTABLE CHEST - 1 VIEW  COMPARISON: 07/07/2014  FINDINGS:  There is chronic elevation of the right diaphragm. Chronic foreign  body in the soft tissues of the right posterior back.  There is progressive interstitial opacity and cephalization of blood  flow. There may be small pleural effusions. Chronic cardiomegaly.  New vascular pedicle widening.  IMPRESSION:  1. Pulmonary edema.  2. Chronic elevation of the right diaphragm.  Electronically Signed  By: Jorje Guild M.D.  On: 07/08/2014 06:46   Physical Exam General appearance: alert and no distress Resp: clear to auscultation bilaterally and shallow Cardio: regular rate and rhythm GI: normal findings: bowel sounds normal and soft, non-tender   Assessment/Plan: [facility] Left gluteal hematoma Hypoxia -- Pulmonology consult Multiple medical problems -- Home meds FEN -- SL IV, non-narcotics for pain VTE -- SCD's, Lovenox (increase  for weight) Dispo -- Pulmonology consult     Lisette Abu, PA-C Pager: [phone] General Trauma [place] Pager: [phone]  07/08/2014

## 2014-07-08 NOTE — ED Notes (Signed)
Flow called to inquired about bed request
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
## 2014-07-08 NOTE — Progress Notes (Signed)
UR completed 

## 2014-07-08 NOTE — Progress Notes (Signed)
Entered patient's room to give breathing treatment and BIPAP check when patient said he was going to vomit.  Removed patient from BIPAP.  Patient did vomit.  RN aware.  Placed patient on 40% VM at this time.  RT will continue to monitor.

## 2014-07-08 NOTE — Progress Notes (Signed)
Critical ABG values called to Eustace Moore, RN at 1659 by Sharyne Peach RRT, RCP on 07/08/14.

## 2014-07-08 NOTE — Progress Notes (Signed)
We appreciate pulmonary service consultation.  This will be a complex management problem because of his severe longterm COPD.  Without any acute injuries, it would be best if this were managed primarily by the pulmonary service.  i will speak with Dr. Lamonte Sakai.  This patient has been seen and I agree with the findings and treatment plan.  Kathryne Eriksson. Dahlia Bailiff, MD, [place] [phone] (pager) [phone] (direct pager) Trauma Surgeon

## 2014-07-09 ENCOUNTER — Inpatient Hospital Stay (HOSPITAL_COMMUNITY): Payer: No Typology Code available for payment source

## 2014-07-09 DIAGNOSIS — S2232XA Fracture of one rib, left side, initial encounter for closed fracture: Secondary | ICD-10-CM

## 2014-07-09 DIAGNOSIS — J449 Chronic obstructive pulmonary disease, unspecified: Secondary | ICD-10-CM | POA: Diagnosis present

## 2014-07-09 DIAGNOSIS — I517 Cardiomegaly: Secondary | ICD-10-CM

## 2014-07-09 DIAGNOSIS — R0902 Hypoxemia: Secondary | ICD-10-CM

## 2014-07-09 LAB — BASIC METABOLIC PANEL
ANION GAP: 16 — AB (ref 5–15)
BUN: 30 mg/dL — ABNORMAL HIGH (ref 6–23)
CHLORIDE: 92 meq/L — AB (ref 96–112)
CO2: 30 mEq/L (ref 19–32)
Calcium: 9.1 mg/dL (ref 8.4–10.5)
Creatinine, Ser: 1.56 mg/dL — ABNORMAL HIGH (ref 0.50–1.35)
GFR, EST AFRICAN AMERICAN: 51 mL/min — AB (ref >90)
GFR, EST NON AFRICAN AMERICAN: 44 mL/min — AB (ref >90)
Glucose, Bld: 135 mg/dL — ABNORMAL HIGH (ref 70–99)
Potassium: 3.6 mEq/L — ABNORMAL LOW (ref 3.7–5.3)
Sodium: 138 mEq/L (ref 137–147)

## 2014-07-09 MED ORDER — OXYCODONE HCL 5 MG PO TABS
10.0000 mg | ORAL_TABLET | ORAL | Status: DC | PRN
  Administered 2014-07-09 – 2014-07-13 (×11): 20 mg via ORAL
  Filled 2014-07-09: qty 2
  Filled 2014-07-09 (×2): qty 4
  Filled 2014-07-09: qty 2
  Filled 2014-07-09 (×8): qty 4

## 2014-07-09 MED ORDER — PRAMIPEXOLE DIHYDROCHLORIDE 1 MG PO TABS
1.0000 mg | ORAL_TABLET | Freq: Every evening | ORAL | Status: DC | PRN
  Filled 2014-07-09: qty 1

## 2014-07-09 MED ORDER — MONTELUKAST SODIUM 10 MG PO TABS
10.0000 mg | ORAL_TABLET | Freq: Every day | ORAL | Status: DC
  Administered 2014-07-09 – 2014-07-12 (×3): 10 mg via ORAL
  Filled 2014-07-09 (×7): qty 1
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
## 2014-07-09 NOTE — Progress Notes (Signed)
Pt continues intermittent nausea, Respiratory Therapy checking on Pt. Patient A & O X 3, resting with some diaphoresis. MD notified will continue to  Monitor closely.

## 2014-07-09 NOTE — Progress Notes (Signed)
*  PRELIMINARY RESULTS* Echocardiogram 2D Echocardiogram has been performed.  Leavy Cella 07/09/2014, 3:07 PM

## 2014-07-09 NOTE — Progress Notes (Signed)
Painful for patient

## 2014-07-09 NOTE — Progress Notes (Signed)
Patient ID: Daniel [name], male   DOB: 07/25/1946, 68 y.o.   MRN: [id number]   LOS: 2 days   Subjective: C/o pain posterior right shoulder and on down. Says tramadol not helping at all. On Nucynta at home.   Objective: Vital signs in last 24 hours: Temp:  [97.6 F (36.4 C)-99.6 F (37.6 C)] 97.8 F (36.6 C) (10/15 0734) Pulse Rate:  [86-110] 87 (10/15 0319) Resp:  [15-24] 19 (10/15 0319) BP: (114-137)/(48-89) 119/48 mmHg (10/15 0319) SpO2:  [91 %-96 %] 94 % (10/15 0734) FiO2 (%):  [40 %-45 %] 40 % (10/15 0734) Last BM Date: 07/07/14   Physical Exam General appearance: alert and no distress Resp: clear to auscultation bilaterally Cardio: irregularly irregular rhythm GI: normal findings: bowel sounds normal and soft, non-tender   Assessment/Plan: [facility]  Left gluteal hematoma  Hypoxia -- Appreciate pulmonology consult. Improved from yesterday. Hard to tell if pain he's having is aggravation of his chronic pain that we are not effectively treating or new. Will give OxyIR (as we can't get Nucynta) with respiratory improvement. Multiple medical problems -- Home meds       Lisette Abu, PA-C Pager: [phone] General Trauma PA Pager: [phone]  07/09/2014

## 2014-07-09 NOTE — Progress Notes (Signed)
Patient has refused the CPAP machine for tonight. Patient says he usually wears one at home but does not want to try the hospital machine. Patient is currently resting comfortably on 4 lpm [place] with o2 sats around 92%. Patient is aware to call if he does change his mind. RT will continue to assist as needed.

## 2014-07-09 NOTE — Progress Notes (Signed)
Patient up in chair. Will place on patient when he is back in bed.

## 2014-07-09 NOTE — Progress Notes (Signed)
Patient examined and I agree with the assessment and plan  Georganna Skeans, MD, MPH, FACS Trauma: [phone] General Surgery: [phone]  07/09/2014 9:42 AM

## 2014-07-10 ENCOUNTER — Inpatient Hospital Stay (HOSPITAL_COMMUNITY): Payer: No Typology Code available for payment source

## 2014-07-10 LAB — CBC
HCT: 46.7 % (ref 39.0–52.0)
Hemoglobin: 14.7 g/dL (ref 13.0–17.0)
MCH: 25.9 pg — ABNORMAL LOW (ref 26.0–34.0)
MCHC: 31.5 g/dL (ref 30.0–36.0)
MCV: 82.2 fL (ref 78.0–100.0)
PLATELETS: 199 10*3/uL (ref 150–400)
RBC: 5.68 MIL/uL (ref 4.22–5.81)
RDW: 16.4 % — ABNORMAL HIGH (ref 11.5–15.5)
WBC: 16.1 10*3/uL — AB (ref 4.0–10.5)

## 2014-07-10 LAB — BLOOD GAS, ARTERIAL
ACID-BASE EXCESS: 6.7 mmol/L — AB (ref 0.0–2.0)
Acid-Base Excess: 5.9 mmol/L — ABNORMAL HIGH (ref 0.0–2.0)
BICARBONATE: 32 meq/L — AB (ref 20.0–24.0)
Bicarbonate: 32.2 mEq/L — ABNORMAL HIGH (ref 20.0–24.0)
DRAWN BY: 27407
Drawn by: 252031
O2 CONTENT: 3 L/min
O2 Content: 4 L/min
O2 SAT: 84.1 %
O2 SAT: 91.3 %
PATIENT TEMPERATURE: 98.6
PO2 ART: 60.7 mmHg — AB (ref 80.0–100.0)
Patient temperature: 98.6
TCO2: 34.4 mmol/L (ref 0–100)
TCO2: 33.8 mmol/L (ref 0–100)
pCO2 arterial: 69.6 mmHg (ref 35.0–45.0)
pCO2 arterial: 58.2 mmHg (ref 35.0–45.0)
pH, Arterial: 7.287 — ABNORMAL LOW (ref 7.350–7.450)
pH, Arterial: 7.36 (ref 7.350–7.450)
pO2, Arterial: 53.1 mmHg — ABNORMAL LOW (ref 80.0–100.0)

## 2014-07-10 LAB — BASIC METABOLIC PANEL
ANION GAP: 14 (ref 5–15)
BUN: 43 mg/dL — AB (ref 6–23)
CO2: 30 mEq/L (ref 19–32)
Calcium: 8.7 mg/dL (ref 8.4–10.5)
Chloride: 93 mEq/L — ABNORMAL LOW (ref 96–112)
Creatinine, Ser: 1.73 mg/dL — ABNORMAL HIGH (ref 0.50–1.35)
GFR, EST AFRICAN AMERICAN: 45 mL/min — AB (ref >90)
GFR, EST NON AFRICAN AMERICAN: 39 mL/min — AB (ref >90)
Glucose, Bld: 137 mg/dL — ABNORMAL HIGH (ref 70–99)
POTASSIUM: 3.8 meq/L (ref 3.7–5.3)
SODIUM: 137 meq/L (ref 137–147)

## 2014-07-10 LAB — GLUCOSE, CAPILLARY: Glucose-Capillary: 155 mg/dL — ABNORMAL HIGH (ref 70–99)
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
## 2014-07-11 DIAGNOSIS — J42 Unspecified chronic bronchitis: Secondary | ICD-10-CM

## 2014-07-11 DIAGNOSIS — S2232XA Fracture of one rib, left side, initial encounter for closed fracture: Secondary | ICD-10-CM

## 2014-07-11 DIAGNOSIS — J441 Chronic obstructive pulmonary disease with (acute) exacerbation: Secondary | ICD-10-CM

## 2014-07-11 DIAGNOSIS — S2239XD Fracture of one rib, unspecified side, subsequent encounter for fracture with routine healing: Secondary | ICD-10-CM

## 2014-07-11 LAB — BASIC METABOLIC PANEL
Anion gap: 13 (ref 5–15)
BUN: 52 mg/dL — AB (ref 6–23)
CHLORIDE: 87 meq/L — AB (ref 96–112)
CO2: 33 mEq/L — ABNORMAL HIGH (ref 19–32)
Calcium: 9.1 mg/dL (ref 8.4–10.5)
Creatinine, Ser: 1.5 mg/dL — ABNORMAL HIGH (ref 0.50–1.35)
GFR calc Af Amer: 54 mL/min — ABNORMAL LOW (ref >90)
GFR calc non Af Amer: 46 mL/min — ABNORMAL LOW (ref >90)
Glucose, Bld: 192 mg/dL — ABNORMAL HIGH (ref 70–99)
Potassium: 3.2 mEq/L — ABNORMAL LOW (ref 3.7–5.3)
SODIUM: 133 meq/L — AB (ref 137–147)

## 2014-07-11 LAB — MAGNESIUM: Magnesium: 2.7 mg/dL — ABNORMAL HIGH (ref 1.5–2.5)

## 2014-07-11 MED ORDER — DOCUSATE SODIUM 100 MG PO CAPS
200.0000 mg | ORAL_CAPSULE | Freq: Two times a day (BID) | ORAL | Status: DC
  Administered 2014-07-11 – 2014-07-13 (×5): 200 mg via ORAL
  Filled 2014-07-11 (×6): qty 2

## 2014-07-11 MED ORDER — IPRATROPIUM-ALBUTEROL 0.5-2.5 (3) MG/3ML IN SOLN
3.0000 mL | RESPIRATORY_TRACT | Status: DC
  Administered 2014-07-11 – 2014-07-12 (×6): 3 mL via RESPIRATORY_TRACT
  Filled 2014-07-11 (×6): qty 3

## 2014-07-11 MED ORDER — ZOLPIDEM TARTRATE 5 MG PO TABS
5.0000 mg | ORAL_TABLET | Freq: Every evening | ORAL | Status: DC | PRN
  Administered 2014-07-11 – 2014-07-12 (×2): 5 mg via ORAL
  Filled 2014-07-11 (×2): qty 1

## 2014-07-11 MED ORDER — POLYETHYLENE GLYCOL 3350 17 G PO PACK
17.0000 g | PACK | Freq: Every day | ORAL | Status: DC
  Administered 2014-07-11 – 2014-07-13 (×3): 17 g via ORAL
  Filled 2014-07-11 (×3): qty 1
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
## 2014-07-11 NOTE — Progress Notes (Signed)
Pt with abnormal rhythm, with Hr 130-145 during periods, black box/ MT called stated pt has had episodes since yesterday, EKG obtained, results called to PCCM in black box, nursing will cont to montior

## 2014-07-12 DIAGNOSIS — J9622 Acute and chronic respiratory failure with hypercapnia: Secondary | ICD-10-CM

## 2014-07-12 DIAGNOSIS — J9620 Acute and chronic respiratory failure, unspecified whether with hypoxia or hypercapnia: Secondary | ICD-10-CM | POA: Diagnosis present

## 2014-07-12 LAB — BASIC METABOLIC PANEL
ANION GAP: 14 (ref 5–15)
BUN: 41 mg/dL — ABNORMAL HIGH (ref 6–23)
CO2: 33 mEq/L — ABNORMAL HIGH (ref 19–32)
Calcium: 9.3 mg/dL (ref 8.4–10.5)
Chloride: 86 mEq/L — ABNORMAL LOW (ref 96–112)
Creatinine, Ser: 1.29 mg/dL (ref 0.50–1.35)
GFR, EST AFRICAN AMERICAN: 65 mL/min — AB (ref >90)
GFR, EST NON AFRICAN AMERICAN: 56 mL/min — AB (ref >90)
GLUCOSE: 187 mg/dL — AB (ref 70–99)
POTASSIUM: 3.6 meq/L — AB (ref 3.7–5.3)
Sodium: 133 mEq/L — ABNORMAL LOW (ref 137–147)

## 2014-07-12 MED ORDER — IPRATROPIUM-ALBUTEROL 0.5-2.5 (3) MG/3ML IN SOLN
3.0000 mL | Freq: Four times a day (QID) | RESPIRATORY_TRACT | Status: DC
  Administered 2014-07-12 – 2014-07-13 (×4): 3 mL via RESPIRATORY_TRACT
  Filled 2014-07-12 (×4): qty 3
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
## 2014-07-12 NOTE — Progress Notes (Signed)
PT. Alarming with HR of 140's-150's. Pt. Also bigmeny on telemetry. VSS. No s/s of distress. Pt. Denies any pain or discomfort. RR RN, Daniel [name], Daniel [name]

## 2014-07-12 NOTE — Progress Notes (Addendum)
Patient arrived on unit, transferred from 3S.  Rapid Response RN called to assess patient on 3S prior to transfer to ensure patient stable for transfer.  Upon transfer, vital signs stable.  Patient alert and oriented x4.  Patient denies any questions or concerns at this time.  Will give report to oncoming RN, who will continue to monitor.

## 2014-07-12 NOTE — Progress Notes (Signed)
Dr. Lincoln Maxin notified of assessment findings of coarse bibasilar lung sounds and s3 heart sound with periods of tachycardia 110-140s. No orders received at this time. Continue to monitor.

## 2014-07-13 LAB — BASIC METABOLIC PANEL
Anion gap: 12 (ref 5–15)
BUN: 37 mg/dL — ABNORMAL HIGH (ref 6–23)
CO2: 35 mEq/L — ABNORMAL HIGH (ref 19–32)
Calcium: 9.1 mg/dL (ref 8.4–10.5)
Chloride: 89 mEq/L — ABNORMAL LOW (ref 96–112)
Creatinine, Ser: 1.13 mg/dL (ref 0.50–1.35)
GFR calc non Af Amer: 65 mL/min — ABNORMAL LOW (ref >90)
GFR, EST AFRICAN AMERICAN: 76 mL/min — AB (ref >90)
GLUCOSE: 123 mg/dL — AB (ref 70–99)
POTASSIUM: 5.1 meq/L (ref 3.7–5.3)
SODIUM: 136 meq/L — AB (ref 137–147)

## 2014-07-13 MED ORDER — IPRATROPIUM-ALBUTEROL 0.5-2.5 (3) MG/3ML IN SOLN
3.0000 mL | Freq: Four times a day (QID) | RESPIRATORY_TRACT | Status: AC
Start: 2014-07-13 — End: ?

## 2014-07-13 NOTE — Progress Notes (Signed)
Pt's family called RN concerned about pt's discharge.  Reassured family pt was ready for DC.  Family was not happy, PA notified and number given to PA for PA to call family.
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
## 2014-07-13 NOTE — Progress Notes (Signed)
07/13/14 1045 Daniel [name] J. Clydene Laming, RN, BSN, [name] [phone] I have recommended that this patient have [place] but he declines at this time. I have discussed the risks and benefits of this service with him. The patient verbalizes understanding.

## 2014-07-13 NOTE — Progress Notes (Signed)
Patient is A/Ox4 and is ambulatory without assist. He had no signs of distress. He had c/o bilateral rib cage pain and po prn pain medication was given. PA this am was notified of report that was received this am that patient had episode of bigeminy in the 150's around 2000 last night. Pt.has been NSR to ST with PAC's in the 90's-low 100's.

## 2014-07-14 ENCOUNTER — Other Ambulatory Visit: Payer: Self-pay | Admitting: Internal Medicine

## 2014-07-14 MED ORDER — OXYCODONE-ACETAMINOPHEN 5-325 MG PO TABS: 2.0000 | ORAL_TABLET | ORAL | Status: DC | PRN

## 2014-07-28 ENCOUNTER — Ambulatory Visit (INDEPENDENT_AMBULATORY_CARE_PROVIDER_SITE_OTHER): Payer: Medicare Other | Admitting: Internal Medicine

## 2014-07-28 ENCOUNTER — Encounter: Payer: Self-pay | Admitting: Internal Medicine

## 2014-07-28 VITALS — BP 144/86 | HR 90 | Ht 74.0 in | Wt 245.0 lb

## 2014-07-28 DIAGNOSIS — Z23 Encounter for immunization: Secondary | ICD-10-CM

## 2014-07-28 DIAGNOSIS — J449 Chronic obstructive pulmonary disease, unspecified: Secondary | ICD-10-CM

## 2014-07-28 DIAGNOSIS — G4730 Sleep apnea, unspecified: Secondary | ICD-10-CM

## 2014-07-28 DIAGNOSIS — R911 Solitary pulmonary nodule: Secondary | ICD-10-CM

## 2014-07-28 DIAGNOSIS — R0600 Dyspnea, unspecified: Secondary | ICD-10-CM
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
## 2014-07-28 NOTE — Assessment & Plan Note (Addendum)
Continue Advair Spiriva. The patient advised on cessation of marijuana use. Given the severe restrictive changes on his recent PFTs, difficult to estimate the degree of obstruction. Will consider repeat PFTs in 6 months Currently patient with no morning cough, morning sputum production, wheezing, or severe shortness of breath. Continue with supportive care. Will discuss restrictive lung disease workup with patient at his next visit, which may include autoimmune workup and inflammatory marker testing. Pneumonia vaccine today

## 2014-07-28 NOTE — Assessment & Plan Note (Signed)
Patient with history of COPD and greater than 68 years old

## 2014-07-28 NOTE — Patient Instructions (Signed)
We are going to request records from Dr. Gust Brooms office. We are going to try to get your old sleep study from [facility], and order a new sleep study at [facility] We are going to refer you to pulmonary rehab at [facility]. We gave you the Prevnar pneumonia vaccine today.  Follow up in 1 month.
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
## 2014-07-28 NOTE — Assessment & Plan Note (Signed)
Patient states that he was diagnosed with obstructive sleep apnea 10 years ago. Obtain records from [facility]. Will order split-night study, and adjust pressures as needed based on results.

## 2014-08-16 ENCOUNTER — Ambulatory Visit: Payer: Self-pay | Admitting: Internal Medicine

## 2014-08-31 ENCOUNTER — Ambulatory Visit (INDEPENDENT_AMBULATORY_CARE_PROVIDER_SITE_OTHER): Payer: Medicare Other | Admitting: Internal Medicine

## 2014-08-31 ENCOUNTER — Encounter: Payer: Self-pay | Admitting: Internal Medicine

## 2014-08-31 VITALS — BP 120/80 | HR 78 | Temp 97.9°F | Ht 74.0 in | Wt 246.0 lb

## 2014-08-31 DIAGNOSIS — R911 Solitary pulmonary nodule: Secondary | ICD-10-CM

## 2014-08-31 DIAGNOSIS — J449 Chronic obstructive pulmonary disease, unspecified: Secondary | ICD-10-CM

## 2014-08-31 DIAGNOSIS — G4733 Obstructive sleep apnea (adult) (pediatric): Secondary | ICD-10-CM

## 2014-08-31 NOTE — Patient Instructions (Signed)
We will send an order to [facility] for a new CPAP machine. BiPAP Titration study will be scheduled for you at [facility]. Follow up with Dr. Stevenson Clinch in 1 month.
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
## 2014-09-01 DIAGNOSIS — G4733 Obstructive sleep apnea (adult) (pediatric): Secondary | ICD-10-CM | POA: Insufficient documentation

## 2014-09-01 NOTE — Assessment & Plan Note (Signed)
Patient severe sleep apnea: AHI 36  Plan: -based on his split-night results he will require a BiPAP titration due to continued apneas with a CPAP of 16 cm of water. -BiPAP titration ordered. -Until his BiPAP titration, he is advised to continue using his CPAP machine, a new prescription for CPAP 16 cm of water was written. -Patient verbalized understanding of the current management and plan for this obstructive sleep apnea. -Patient educated on risks of untreated sleep apnea and noncompliance with CPAP machine and benefits of treated sleep apnea and compliance greater than 70% with CPAP

## 2014-09-01 NOTE — Assessment & Plan Note (Signed)
Continue Advair and Spiriva. The patient advised on cessation of marijuana use and any other noxious substances Given the severe restrictive changes on his recent PFTs, difficult to estimate the degree of obstruction. Will consider repeat PFTs around May 2016 Currently patient with no morning cough, morning sputum production, wheezing, or severe shortness of breath. Continue with supportive care. Will discuss restrictive lung disease workup with patient at future visit, which may include autoimmune workup and inflammatory marker testing.
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
## 2014-09-21 ENCOUNTER — Ambulatory Visit: Payer: Self-pay | Admitting: Internal Medicine

## 2014-10-02 ENCOUNTER — Ambulatory Visit (INDEPENDENT_AMBULATORY_CARE_PROVIDER_SITE_OTHER): Payer: Medicare Other | Admitting: Podiatry

## 2014-10-02 VITALS — BP 123/74 | HR 100 | Resp 16

## 2014-10-02 DIAGNOSIS — M722 Plantar fascial fibromatosis: Secondary | ICD-10-CM

## 2014-10-02 MED ORDER — TRIAMCINOLONE ACETONIDE 10 MG/ML IJ SUSP
10.0000 mg | Freq: Once | INTRAMUSCULAR | Status: AC
  Administered 2014-10-02: 10 mg
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
## 2014-10-05 ENCOUNTER — Ambulatory Visit (INDEPENDENT_AMBULATORY_CARE_PROVIDER_SITE_OTHER): Payer: Medicare Other | Admitting: Internal Medicine

## 2014-10-05 ENCOUNTER — Encounter: Payer: Self-pay | Admitting: Internal Medicine

## 2014-10-05 VITALS — BP 142/88 | HR 89 | Ht 72.0 in | Wt 251.4 lb

## 2014-10-05 DIAGNOSIS — G4733 Obstructive sleep apnea (adult) (pediatric): Secondary | ICD-10-CM | POA: Diagnosis not present

## 2014-10-05 DIAGNOSIS — J449 Chronic obstructive pulmonary disease, unspecified: Secondary | ICD-10-CM

## 2014-10-05 DIAGNOSIS — R911 Solitary pulmonary nodule: Secondary | ICD-10-CM | POA: Diagnosis not present

## 2014-10-05 NOTE — Assessment & Plan Note (Addendum)
Continue Advair and Spiriva. The patient advised on cessation of marijuana use and any other noxious substances Given the severe restrictive changes on his recent PFTs, difficult to estimate the degree of obstruction. We discussed PFTs and will plan for repeat PFTs/6MWT around May 2016 Currently patient with mild morning cough, and mild dyspnea - advised for wt loss and dietary modifications. Continue with supportive care. If improvement with dyspnea with wt loss and exercise, will consider stopping advair at next visit. Will discuss restrictive lung disease workup with patient at future visit, which may include autoimmune workup and inflammatory marker testing.
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
## 2014-10-05 NOTE — Patient Instructions (Addendum)
We will schedule you for a walk and breathing test. We will get you set up for a CT of chest. We will send a new order for you BiPap therapy. We will see you back in 5 months.

## 2014-10-05 NOTE — Assessment & Plan Note (Addendum)
Patient severe sleep apnea: AHI 36  Plan: -based on his split-night results he continued to have apneas, a BiPAP titration study was recommended and done, which showed resolution of all apneas with a pressure of 17/8, a new rx for bipap was given. -Patient verbalized understanding of the current management and plan for this obstructive sleep apnea. -Patient educated on risks of untreated sleep apnea and noncompliance with BiPAP machine and benefits of treated sleep apnea and compliance greater than 70% with BiCPAP
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
## 2015-01-12 ENCOUNTER — Other Ambulatory Visit: Payer: Self-pay | Admitting: Internal Medicine

## 2015-01-12 ENCOUNTER — Other Ambulatory Visit: Payer: Self-pay | Admitting: *Deleted

## 2015-01-12 DIAGNOSIS — J449 Chronic obstructive pulmonary disease, unspecified: Secondary | ICD-10-CM

## 2015-01-12 DIAGNOSIS — R911 Solitary pulmonary nodule: Secondary | ICD-10-CM

## 2015-01-28 ENCOUNTER — Other Ambulatory Visit: Payer: TRICARE For Life (TFL)

## 2015-01-28 ENCOUNTER — Ambulatory Visit
Admission: RE | Admit: 2015-01-28 | Discharge: 2015-01-28 | Disposition: A | Discharge status: Home / Self Care | Payer: Medicare Other | Source: Ambulatory Visit | Attending: Internal Medicine | Admitting: Internal Medicine

## 2015-01-28 DIAGNOSIS — J449 Chronic obstructive pulmonary disease, unspecified: Secondary | ICD-10-CM

## 2015-01-28 DIAGNOSIS — L928 Other granulomatous disorders of the skin and subcutaneous tissue: Secondary | ICD-10-CM | POA: Insufficient documentation

## 2015-01-28 DIAGNOSIS — R911 Solitary pulmonary nodule: Secondary | ICD-10-CM | POA: Diagnosis present

## 2015-01-28 MED ORDER — IOHEXOL 300 MG/ML  SOLN
75.0000 mL | Freq: Once | INTRAMUSCULAR | Status: AC | PRN
  Administered 2015-01-28: 75 mL via INTRAVENOUS

## 2015-02-09 ENCOUNTER — Other Ambulatory Visit: Payer: TRICARE For Life (TFL)

## 2015-03-23 ENCOUNTER — Ambulatory Visit (INDEPENDENT_AMBULATORY_CARE_PROVIDER_SITE_OTHER): Payer: Medicare Other

## 2015-03-23 ENCOUNTER — Ambulatory Visit (INDEPENDENT_AMBULATORY_CARE_PROVIDER_SITE_OTHER): Payer: Medicare Other | Admitting: Podiatry

## 2015-03-23 VITALS — BP 106/65 | HR 72 | Resp 18 | Wt 250.0 lb

## 2015-03-23 DIAGNOSIS — M10072 Idiopathic gout, left ankle and foot: Secondary | ICD-10-CM

## 2015-03-23 DIAGNOSIS — M79672 Pain in left foot: Secondary | ICD-10-CM

## 2015-03-23 DIAGNOSIS — M109 Gout, unspecified: Secondary | ICD-10-CM

## 2015-03-23 MED ORDER — METHYLPREDNISOLONE 4 MG PO TBPK: ORAL_TABLET | ORAL | Status: AC

## 2015-03-24 LAB — CBC WITH DIFFERENTIAL/PLATELET
Basophils Absolute: 0 10*3/uL (ref 0.0–0.2)
Basos: 0 %
EOS (ABSOLUTE): 0 10*3/uL (ref 0.0–0.4)
Eos: 0 %
Hematocrit: 40.6 % (ref 37.5–51.0)
Hemoglobin: 13.4 g/dL (ref 12.6–17.7)
IMMATURE GRANULOCYTES: 0 %
Immature Grans (Abs): 0 10*3/uL (ref 0.0–0.1)
LYMPHS ABS: 1.1 10*3/uL (ref 0.7–3.1)
Lymphs: 11 %
MCH: 25.8 pg — ABNORMAL LOW (ref 26.6–33.0)
MCHC: 33 g/dL (ref 31.5–35.7)
MCV: 78 fL — ABNORMAL LOW (ref 79–97)
MONOS ABS: 0.6 10*3/uL (ref 0.1–0.9)
Monocytes: 6 %
NEUTROS ABS: 8.1 10*3/uL — AB (ref 1.4–7.0)
NEUTROS PCT: 83 %
Platelets: 198 10*3/uL (ref 150–379)
RBC: 5.19 x10E6/uL (ref 4.14–5.80)
RDW: 15.3 % (ref 12.3–15.4)
WBC: 9.8 10*3/uL (ref 3.4–10.8)

## 2015-03-24 LAB — URIC ACID: Uric Acid: 6.9 mg/dL (ref 3.7–8.6)

## 2015-03-24 LAB — SEDIMENTATION RATE: Sed Rate: 29 mm/hr (ref 0–30)

## 2015-03-25 ENCOUNTER — Encounter: Payer: Self-pay | Admitting: Podiatry

## 2015-03-25 ENCOUNTER — Telehealth: Payer: Self-pay | Admitting: *Deleted

## 2015-03-25 NOTE — Progress Notes (Signed)
Patient ID: Daniel [name], male   DOB: 1946/08/30, 69 y.o.   MRN: [id number]  Subjective: 69 year old male presents the office today with concerns of left foot and ankle pain/swelling and redness that his been ongoing for several days. He states that the pain came on suddenly as well as the redness and swelling. He denies any history of injury or trauma to the area. He states he is curling the process of moving however he denies any type of injury. He denies any changes to his diet recently. He denies any history of gout. He states he has pain mature and remove his left ankle and foot and he has pain with weightbearing. Denies any systemic complaints as fevers, chills, nausea, vomiting. No other complaints at this time.  Objective: AAO x3, NAD DP/PT pulses palpable bilaterally, CRT less than 3 seconds Protective sensation intact with Simms Weinstein monofilament There is edema, erythema diffusely overlying the left distal ankle and on the proximal foot. The erythema appears to faint. There is no areas of fluctuance or crepitus and there is no ascending saline's. There is no open lesions or pre-ulcerative lesions there is no drainage. There is discomfort with ankle joint range of motion as well as we are for range of motion. There is diffuse tenderness to palpation overlying the foot/ankle. No areas of tenderness to bilateral lower extremities. No open lesions or pre-ulcerative lesions.  No overlying edema, erythema, increase in warmth to bilateral lower extremities.  No pain with calf compression, swelling, warmth, erythema bilaterally.   Assessment: 69 year old male left foot/ankle pain, likely gout   Plan: -X-rays were obtained and reviewed with the patient.  -Treatment options discussed including all alternatives, risks, and complications -Discussed likely etiology of the symptoms. At this time it appears be more of gout. There is no open lesions or any other indication for infection. He has no  history of trauma. -Will obtain a CBC, ESR, CRP, uric acid. -Prescribed Medrol Dosepak -He has a cam walker at home. Recommended he wear this. -Follow-up 2 week or sooner if any problems arise. In the meantime, encouraged to call the office with any questions, concerns, change in symptoms. Monitor for any clinical signs or symptoms of infection and directed to call the office immediately should any occur or go to the ER.  Celesta Gentile, DPM

## 2015-03-25 NOTE — Telephone Encounter (Signed)
Labs ok continue with meds and cam walker
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
## 2017-01-21 IMAGING — CT CT CHEST W/ CM
2 of 3 series · 15 of 36 positions shown, 18 images · IV contrast (omnipaque)
Comparison: Chest CT July 07, 2014; chest radiograph July 10, 2014

CLINICAL DATA: Chronic shortness of breath

EXAM:
CT CHEST WITH CONTRAST
TECHNIQUE: Multidetector CT imaging of the chest was performed during
intravenous contrast administration.
CONTRAST:  75mL OMNIPAQUE IOHEXOL 300 MG/ML  SOLN

[Series 2: routine chest with · axial · 0.80mm/px · z∈[-652,-408]mm · 12 of 59 slices shown, 15 images]
[im 5/59  mediastinal]
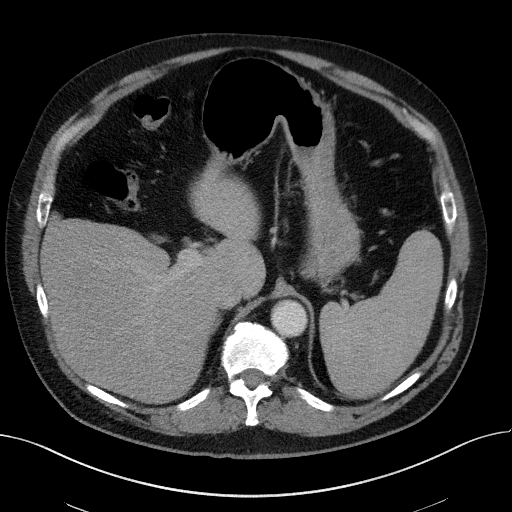
[im 5/59  lung]
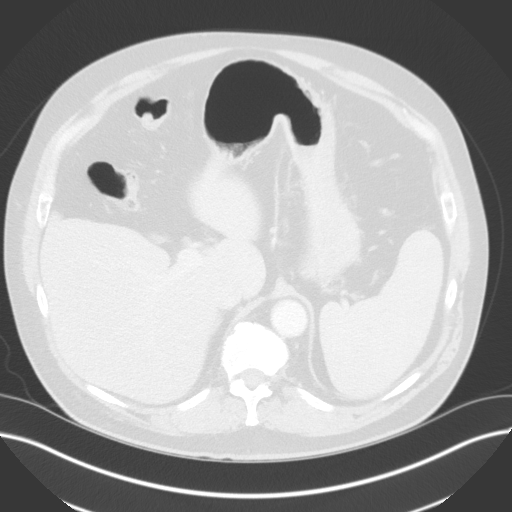
[im 9/59  lung]
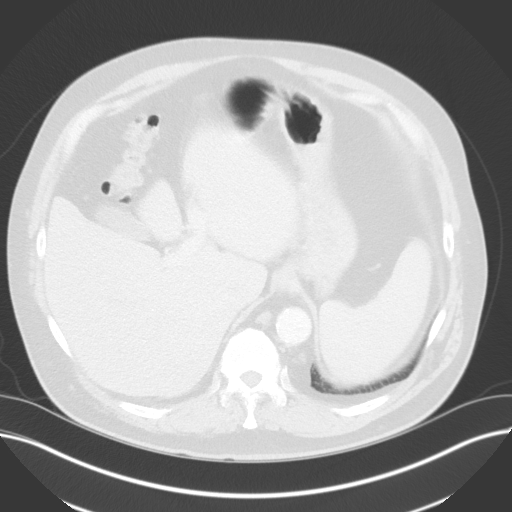
[im 13/59  lung]
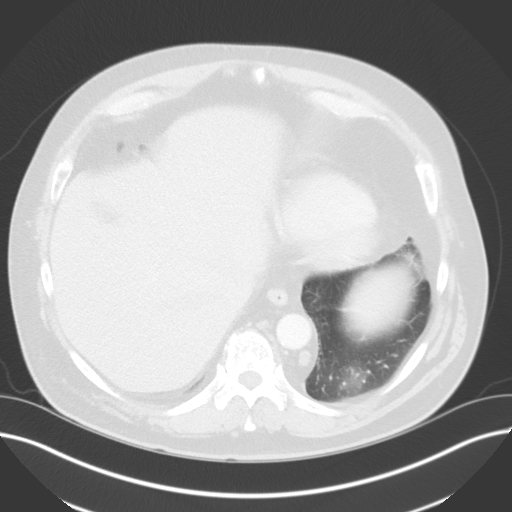
[im 18/59  lung]
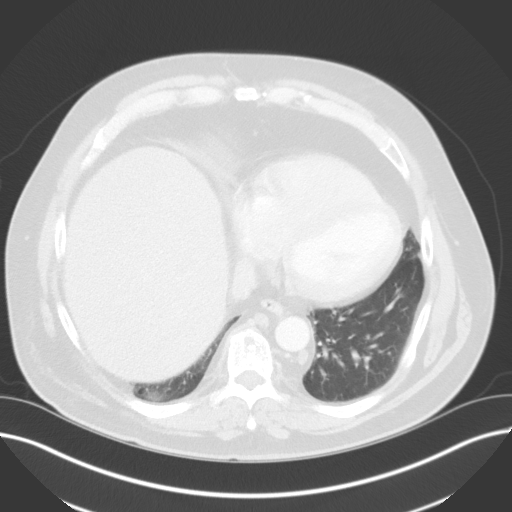
[im 22/59  mediastinal]
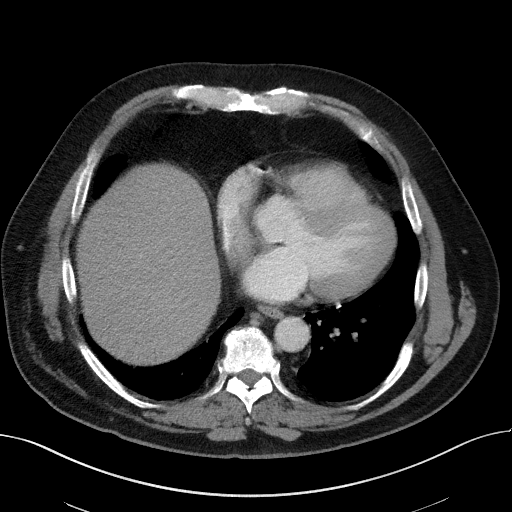
[im 22/59  lung]
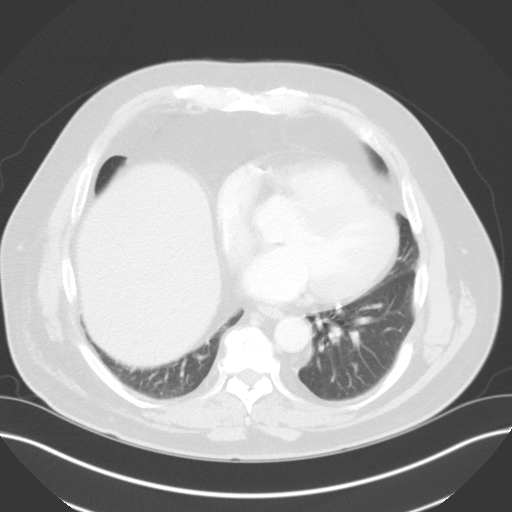
[im 26/59  lung]
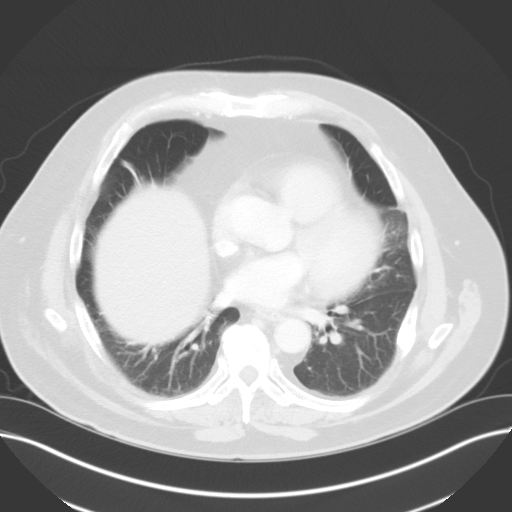
[im 33/59  lung]
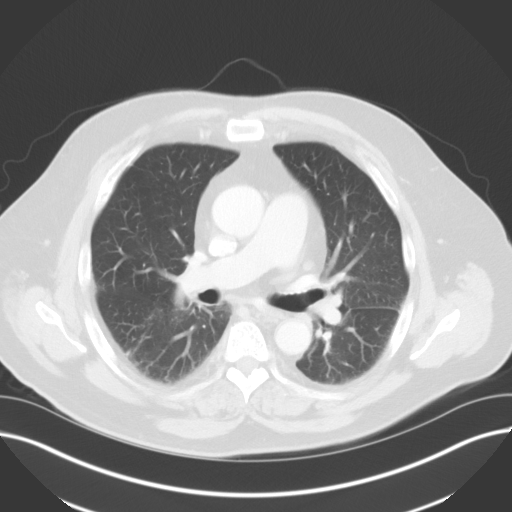
[im 37/59  lung]
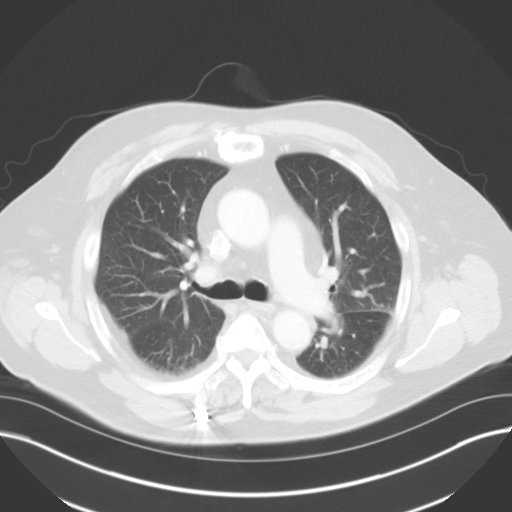
[im 41/59  mediastinal]
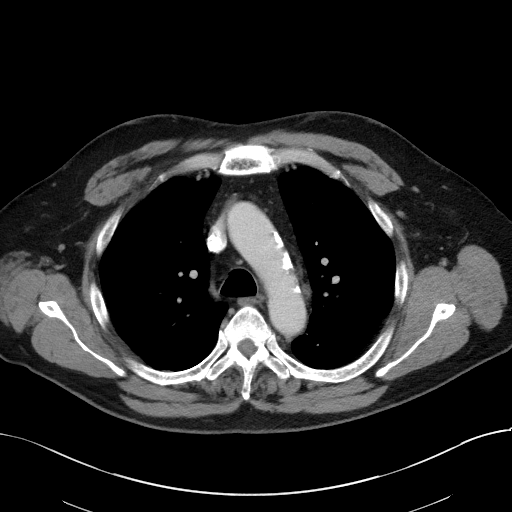
[im 41/59  lung]
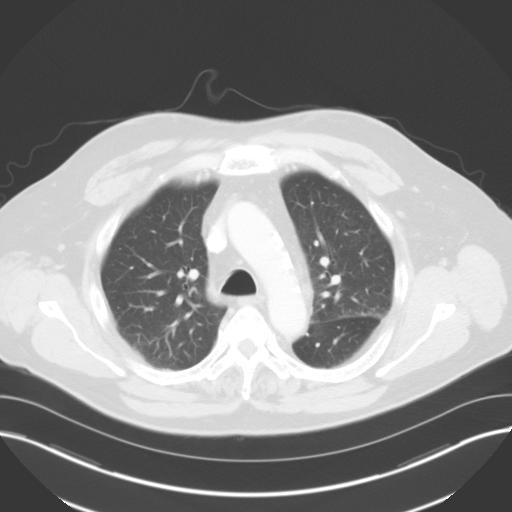
[im 46/59  lung]
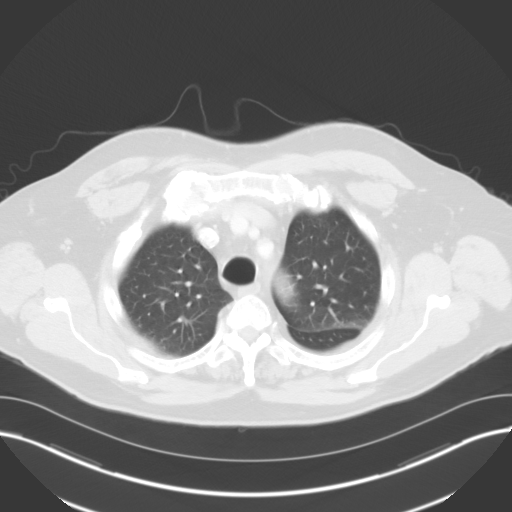
[im 50/59  lung]
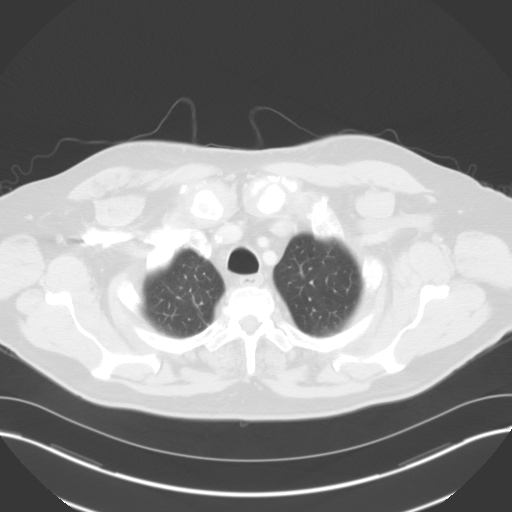
[im 54/59  lung]
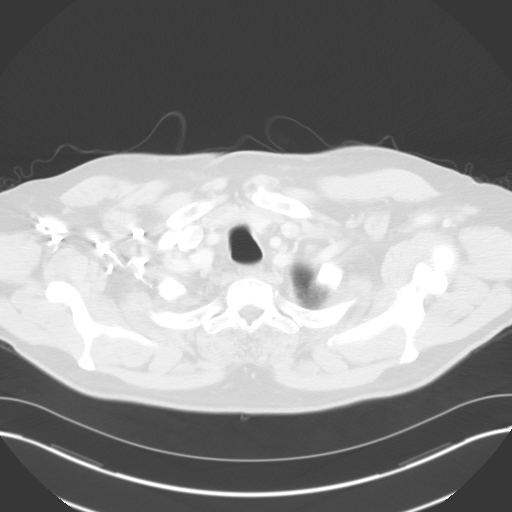

[Series 5: cor routine chest with · coronal · 0.59mm/px · 3 of 177 slices shown]
[im 36/177  lung]
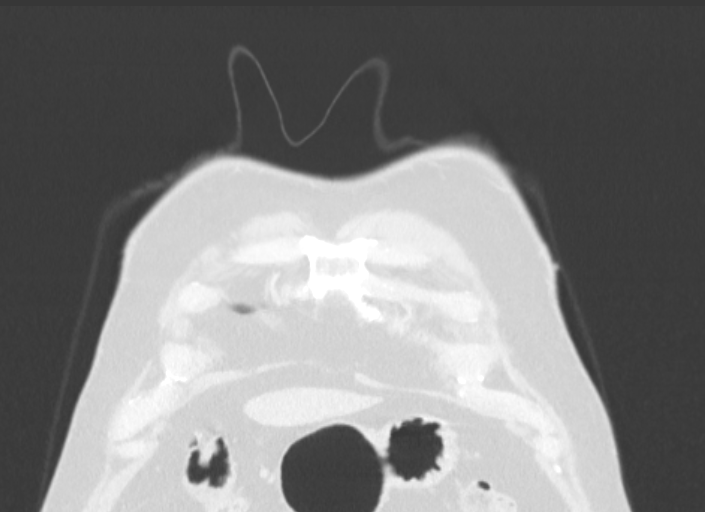
[im 71/177  lung]
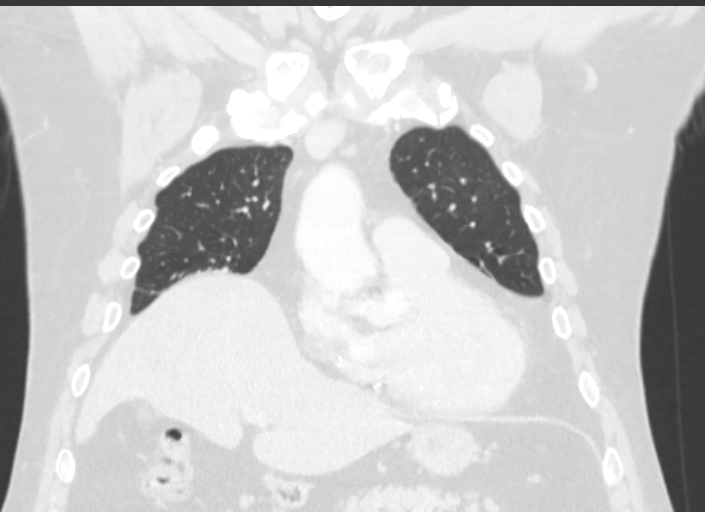
[im 106/177  lung]
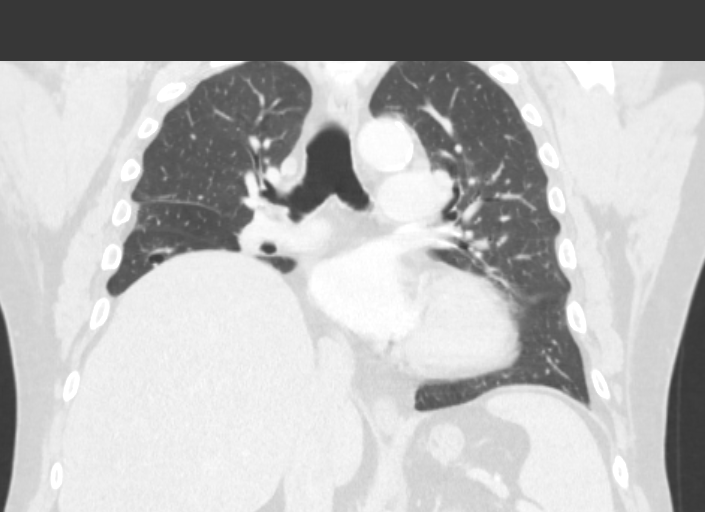

[15 of 36 positions shown; findings below may reference images not displayed]

FINDINGS: There is a stable 4 mm calcified granuloma in the posterior right
apex. There is mild bibasilar atelectatic change. There is no edema
or consolidation. There is no new pulmonary nodular opacity.

Visualized thyroid appears normal. There is no appreciable thoracic
adenopathy. There is atherosclerotic change in the aortic arch
region. There is no thoracic aortic aneurysm or dissection. There is
no major vessel pulmonary embolus. Pericardium is not thickened.
There are foci of coronary artery calcification. There is chronic
elevation of the right hemidiaphragm, stable.

In the visualized upper abdomen, no lesion is appreciable.

There old healed rib fractures bilaterally. There are no blastic or
lytic bone lesions.
IMPRESSION: Stable small granuloma right apex. No new pulmonary nodular lesion.
No edema or consolidation. No adenopathy.

There is chronic elevation of the right hemidiaphragm, stable.

There are foci of coronary artery calcification.

## 2019-10-27 DEATH — deceased
